# Patient Record
Sex: Male | Born: 1945 | Race: White | Hispanic: No | Marital: Married | Smoking: Never smoker
Health system: Southern US, Community
[De-identification: ages and names within clinical notes are randomized; demographics above are authoritative.]

## PROBLEM LIST (undated history)

## (undated) DIAGNOSIS — N189 Chronic kidney disease, unspecified: Secondary | ICD-10-CM

## (undated) DIAGNOSIS — N200 Calculus of kidney: Secondary | ICD-10-CM

## (undated) DIAGNOSIS — K5090 Crohn's disease, unspecified, without complications: Secondary | ICD-10-CM

## (undated) DIAGNOSIS — M8580 Other specified disorders of bone density and structure, unspecified site: Secondary | ICD-10-CM

## (undated) HISTORY — PX: COLOSTOMY: SHX63

---

## 2012-03-24 ENCOUNTER — Emergency Department: Payer: Self-pay | Admitting: Emergency Medicine

## 2012-03-24 LAB — CBC
HCT: 42 % (ref 40.0–52.0)
HGB: 13.8 g/dL (ref 13.0–18.0)
MCH: 31.3 pg (ref 26.0–34.0)
MCHC: 32.7 g/dL (ref 32.0–36.0)
MCV: 96 fL (ref 80–100)
RDW: 14.3 % (ref 11.5–14.5)
WBC: 10.5 10*3/uL (ref 3.8–10.6)

## 2012-03-24 LAB — COMPREHENSIVE METABOLIC PANEL
Albumin: 4.4 g/dL (ref 3.4–5.0)
Alkaline Phosphatase: 69 U/L (ref 50–136)
Anion Gap: 9 (ref 7–16)
Bilirubin,Total: 1 mg/dL (ref 0.2–1.0)
Calcium, Total: 9.4 mg/dL (ref 8.5–10.1)
EGFR (Non-African Amer.): 37 — ABNORMAL LOW
Glucose: 146 mg/dL — ABNORMAL HIGH (ref 65–99)
Potassium: 3.8 mmol/L (ref 3.5–5.1)
SGOT(AST): 32 U/L (ref 15–37)
SGPT (ALT): 32 U/L (ref 12–78)

## 2012-03-25 LAB — URINALYSIS, COMPLETE
Bacteria: NONE SEEN
Bilirubin,UR: NEGATIVE
Granular Cast: 11
Hyaline Cast: 5
Ketone: NEGATIVE
Nitrite: NEGATIVE
Ph: 6 (ref 4.5–8.0)
RBC,UR: 2 /HPF (ref 0–5)
Specific Gravity: 1.024 (ref 1.003–1.030)
Squamous Epithelial: <1
WBC UR: 3 /HPF (ref 0–5)

## 2013-12-31 IMAGING — CT CT ABD-PELV W/O CM
1 of 2 series · 15 of 32 positions shown, 19 images · non-contrast
Comparison: None

REASON FOR EXAM: (1) N/V/D; (2) H/O CROHN'S S/P COLOSTOMY
COMMENTS:   May transport without cardiac monitor

PROCEDURE:     CT  - CT ABDOMEN AND PELVIS W[DATE]  [DATE]
RESULT:     Indication: Flank Pain
TECHNIQUE: Multiple axial images from the lung bases to the symphysis pubis
were obtained without oral and without intravenous contrast.

[Series 2: 3mm soft tissue · axial · 0.64mm/px · z∈[-288,+134]mm · 15 of 153 slices shown, 19 images]
[im 6/153  soft-tissue]
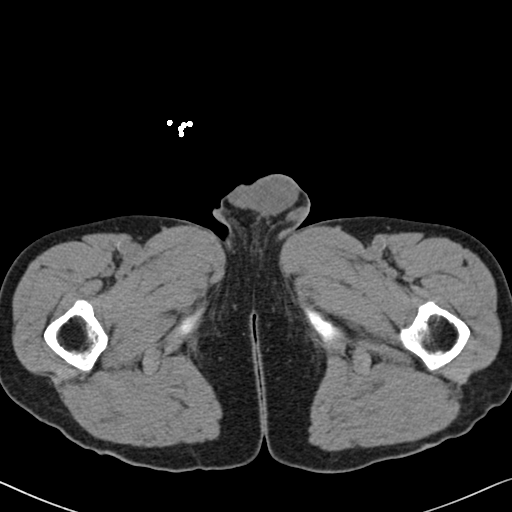
[im 6/153  bone]
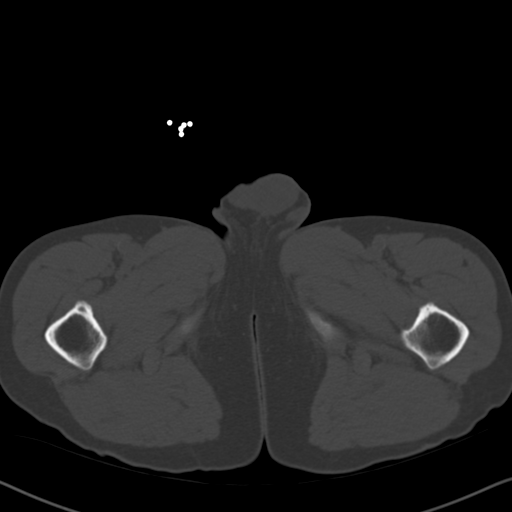
[im 18/153  soft-tissue]
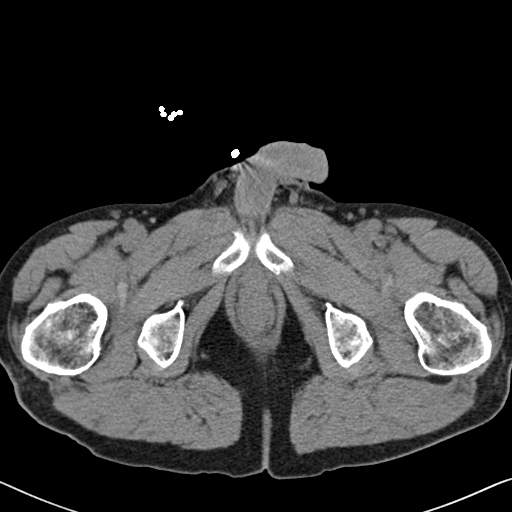
[im 30/153  soft-tissue]
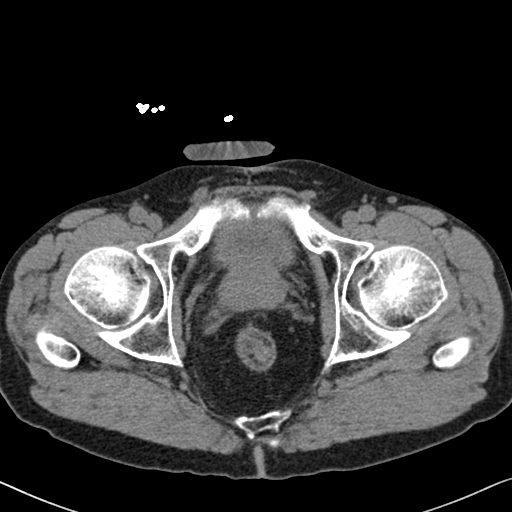
[im 41/153  soft-tissue]
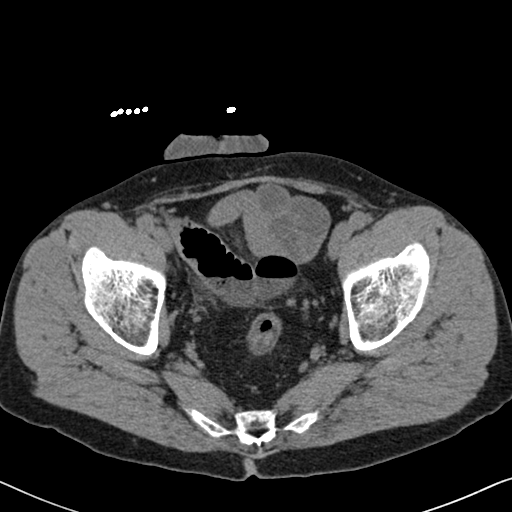
[im 53/153  soft-tissue]
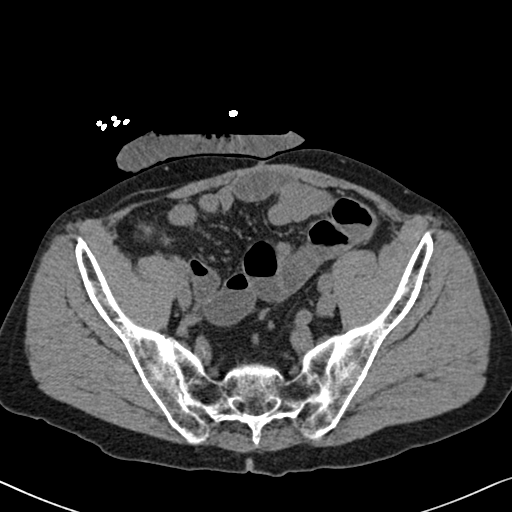
[im 65/153  soft-tissue]
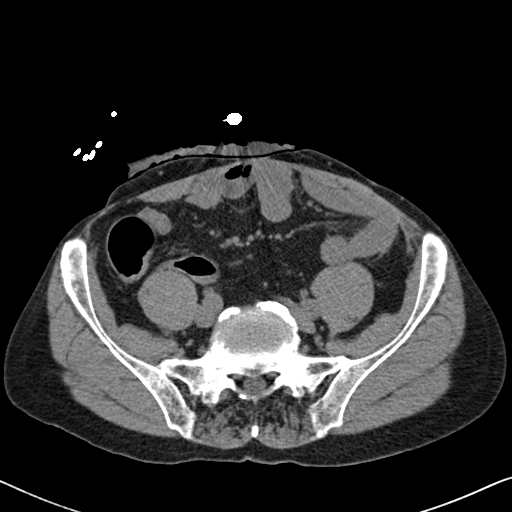
[im 77/153  soft-tissue]
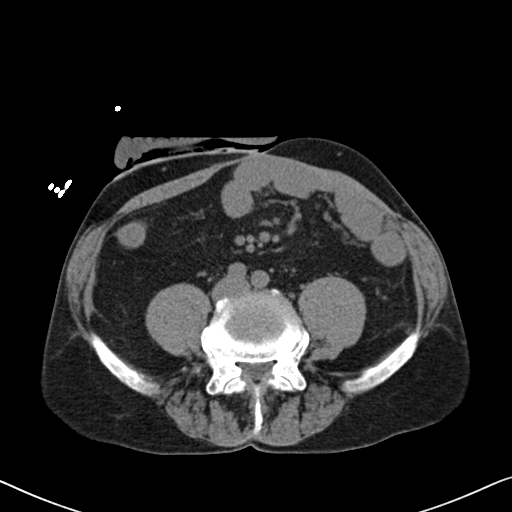
[im 88/153  soft-tissue]
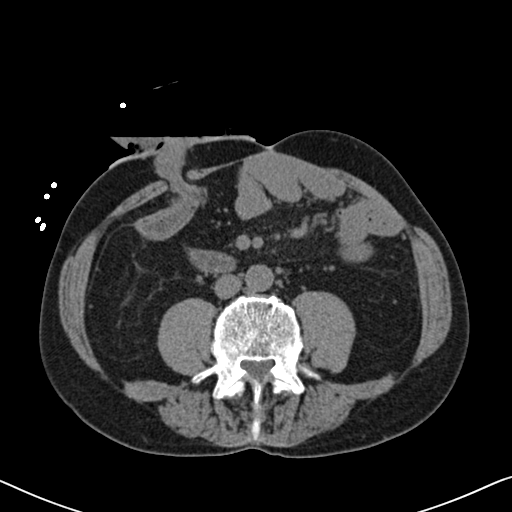
[im 100/153  soft-tissue]
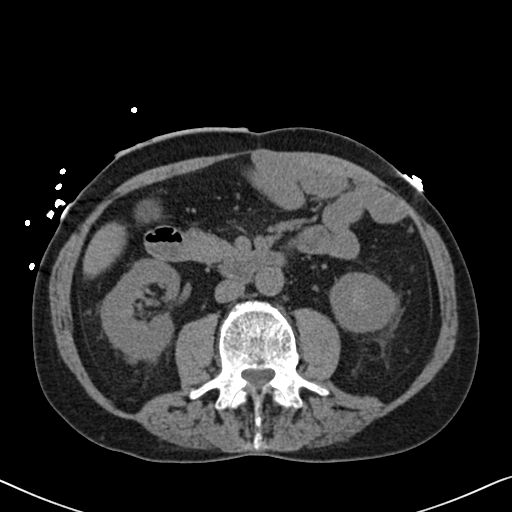
[im 100/153  bone]
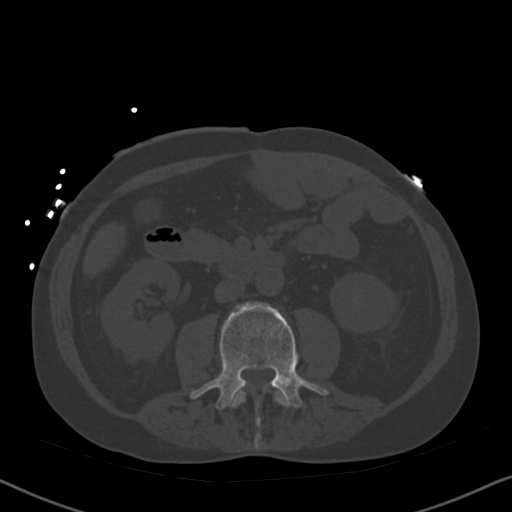
[im 112/153  soft-tissue]
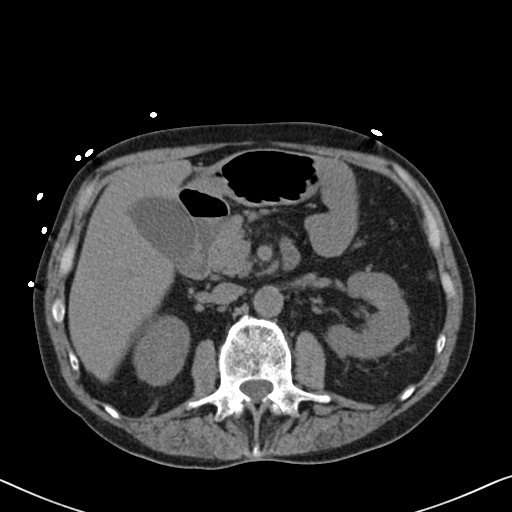
[im 123/153  soft-tissue]
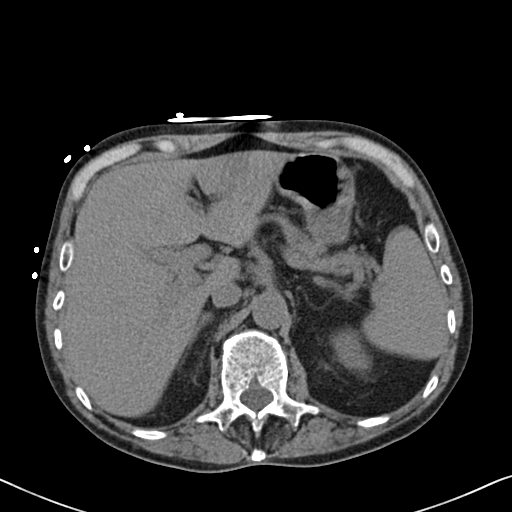
[im 129/153  lung]
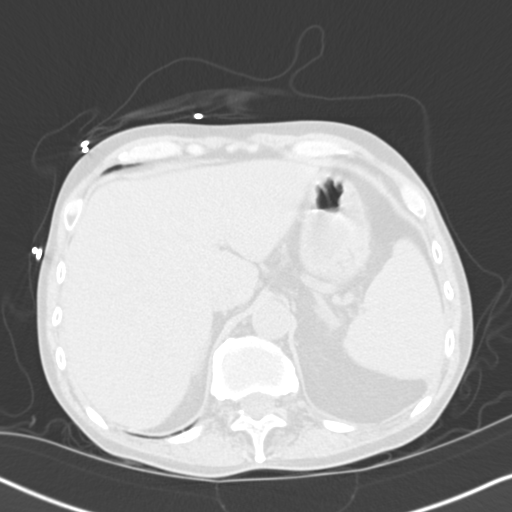
[im 135/153  soft-tissue]
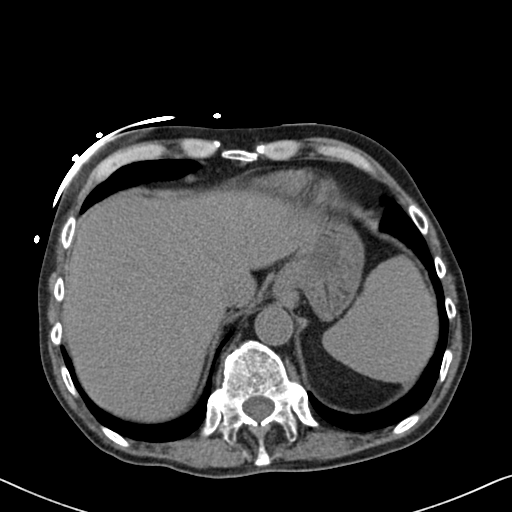
[im 135/153  lung]
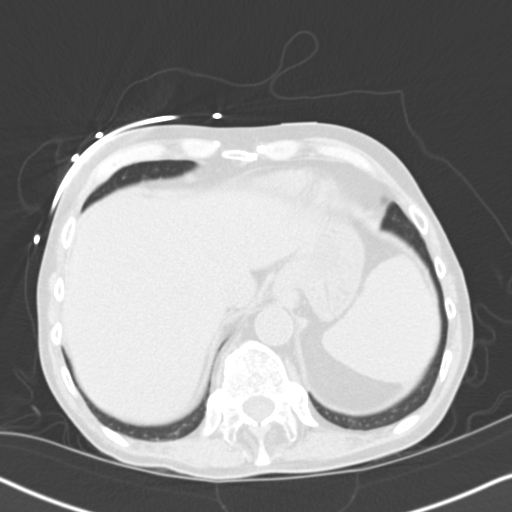
[im 141/153  lung]
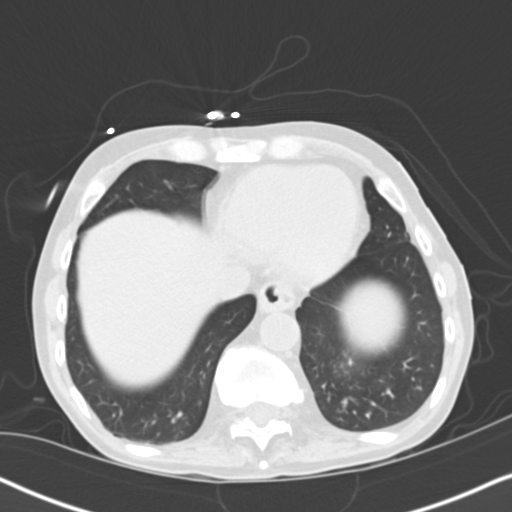
[im 147/153  soft-tissue]
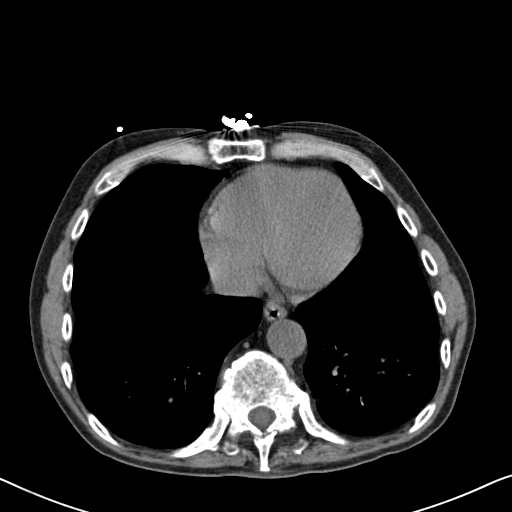
[im 147/153  lung]
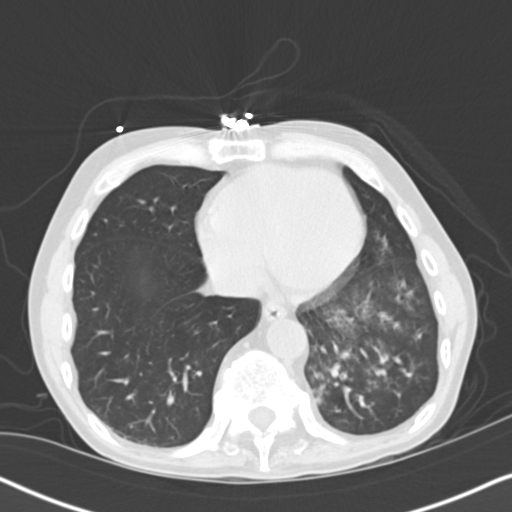

[15 of 32 positions shown; findings below may reference images not displayed]

FINDINGS: There is left lower lobe patchy airspace disease a with a tree in bud
appearance.

There is a nonobstructing left renal calculus. There is a 6.5 mm distal
right ureteral calculus without obstructive uropathy. No perinephric
stranding is seen. The kidneys are symmetric in size without evidence for
exophytic mass. The bladder is unremarkable.

The liver demonstrates no focal abnormality. The gallbladder is
unremarkable. The spleen demonstrates no focal abnormality. The adrenal
glands and pancreas are normal.

There is a right lower quadrant colostomy. There is no bowel dilatation
suggestive structure. There are a few small bowel air-fluid levels without
bowel wall thickening. There is no pneumoperitoneum, pneumatosis, or portal
venous gas. There is no abdominal or pelvic free fluid. There is no
lymphadenopathy.

The abdominal aorta is normal in caliber with atherosclerosis.

The osseous structures are unremarkable.
IMPRESSION: 1. There is a 6.5 mm distal right ureteral calculus without obstructive
uropathy.

2. There are a few small bowel air-fluid levels without bowel wall
thickening. This may secondary to enteritis secondary to an infectious or
inflammatory etiology. There is no bowel obstruction.

3.  Left liver lobe pneumonitis concerning for an infectious or inflammatory
etiology including atypical infection such as RITTMANN.

4. Nonobstructing left renal calculus.

[REDACTED]

## 2019-05-19 ENCOUNTER — Emergency Department
Admission: EM | Admit: 2019-05-19 | Discharge: 2019-05-19 | Disposition: A | Discharge status: Home / Self Care | Payer: BC Managed Care – PPO | Attending: Student | Admitting: Student

## 2019-05-19 ENCOUNTER — Other Ambulatory Visit: Payer: Self-pay

## 2019-05-19 ENCOUNTER — Encounter: Payer: Self-pay | Admitting: Emergency Medicine

## 2019-05-19 DIAGNOSIS — R42 Dizziness and giddiness: Secondary | ICD-10-CM | POA: Diagnosis present

## 2019-05-19 DIAGNOSIS — E860 Dehydration: Secondary | ICD-10-CM | POA: Diagnosis not present

## 2019-05-19 DIAGNOSIS — Z931 Gastrostomy status: Secondary | ICD-10-CM | POA: Insufficient documentation

## 2019-05-19 HISTORY — DX: Calculus of kidney: N20.0

## 2019-05-19 HISTORY — DX: Chronic kidney disease, unspecified: N18.9

## 2019-05-19 HISTORY — DX: Crohn's disease, unspecified, without complications: K50.90

## 2019-05-19 HISTORY — DX: Other specified disorders of bone density and structure, unspecified site: M85.80

## 2019-05-19 LAB — CBC WITH DIFFERENTIAL/PLATELET
Abs Immature Granulocytes: 0.04 10*3/uL (ref 0.00–0.07)
Basophils Absolute: 0 10*3/uL (ref 0.0–0.1)
Basophils Relative: 0 %
Eosinophils Absolute: 0 10*3/uL (ref 0.0–0.5)
Eosinophils Relative: 0 %
HCT: 40.8 % (ref 39.0–52.0)
Hemoglobin: 13.6 g/dL (ref 13.0–17.0)
Immature Granulocytes: 0 %
Lymphocytes Relative: 1 %
Lymphs Abs: 0.1 10*3/uL — ABNORMAL LOW (ref 0.7–4.0)
MCH: 31.5 pg (ref 26.0–34.0)
MCHC: 33.3 g/dL (ref 30.0–36.0)
MCV: 94.4 fL (ref 80.0–100.0)
Monocytes Absolute: 0.5 10*3/uL (ref 0.1–1.0)
Monocytes Relative: 5 %
Neutro Abs: 8.9 10*3/uL — ABNORMAL HIGH (ref 1.7–7.7)
Neutrophils Relative %: 94 %
Platelets: 144 10*3/uL — ABNORMAL LOW (ref 150–400)
RBC: 4.32 MIL/uL (ref 4.22–5.81)
RDW: 13.8 % (ref 11.5–15.5)
WBC: 9.5 10*3/uL (ref 4.0–10.5)
nRBC: 0 % (ref 0.0–0.2)

## 2019-05-19 LAB — COMPREHENSIVE METABOLIC PANEL
ALT: 24 U/L (ref 0–44)
AST: 29 U/L (ref 15–41)
Albumin: 4.5 g/dL (ref 3.5–5.0)
Alkaline Phosphatase: 43 U/L (ref 38–126)
Anion gap: 12 (ref 5–15)
BUN: 30 mg/dL — ABNORMAL HIGH (ref 8–23)
CO2: 21 mmol/L — ABNORMAL LOW (ref 22–32)
Calcium: 9.1 mg/dL (ref 8.9–10.3)
Chloride: 103 mmol/L (ref 98–111)
Creatinine, Ser: 2.16 mg/dL — ABNORMAL HIGH (ref 0.61–1.24)
GFR calc Af Amer: 34 mL/min — ABNORMAL LOW (ref >60)
GFR calc non Af Amer: 30 mL/min — ABNORMAL LOW (ref >60)
Glucose, Bld: 117 mg/dL — ABNORMAL HIGH (ref 70–99)
Potassium: 3.9 mmol/L (ref 3.5–5.1)
Sodium: 136 mmol/L (ref 135–145)
Total Bilirubin: 0.9 mg/dL (ref 0.3–1.2)
Total Protein: 8.4 g/dL — ABNORMAL HIGH (ref 6.5–8.1)

## 2019-05-19 LAB — TROPONIN I (HIGH SENSITIVITY): Troponin I (High Sensitivity): 6 ng/L (ref <18)

## 2019-05-19 MED ORDER — SODIUM CHLORIDE 0.9 % IV BOLUS
500.0000 mL | Freq: Once | INTRAVENOUS | Status: AC
  Administered 2019-05-19: 500 mL via INTRAVENOUS

## 2019-05-19 MED ORDER — SODIUM CHLORIDE 0.9 % IV BOLUS
1500.0000 mL | Freq: Once | INTRAVENOUS | Status: AC
  Administered 2019-05-19: 1500 mL via INTRAVENOUS

## 2019-05-19 MED ORDER — ACETAMINOPHEN 500 MG PO TABS
1000.0000 mg | ORAL_TABLET | Freq: Once | ORAL | Status: AC
  Administered 2019-05-19: 1000 mg via ORAL
  Filled 2019-05-19: qty 2

## 2019-05-19 MED ORDER — ONDANSETRON 4 MG PO TBDP
4.0000 mg | ORAL_TABLET | Freq: Once | ORAL | Status: AC
  Administered 2019-05-19: 4 mg via ORAL
  Filled 2019-05-19: qty 1

## 2019-05-19 NOTE — ED Notes (Signed)

## 2019-05-19 NOTE — ED Triage Notes (Signed)
Pt to triage via w/c with no distress noted, mask in place; EMS brought pt in from home for onset dizziness PTA while emptying colostomy bag; pt reports colostomy has had recent increased output; denies any c/o at present

## 2019-05-19 NOTE — ED Notes (Signed)
PAtient given gingerale

## 2019-05-19 NOTE — ED Provider Notes (Signed)
[facility] Emergency Department Provider Note  ____________________________________________   First MD Initiated Contact with Patient 05/19/19 [phone]     (approximate)  I have reviewed the triage vital signs and the nursing notes.  History  Chief Complaint Dizziness    HPI Dylan Rich. is a 74 y.o. male with history CKD, Crohn's disease status post colostomy formation, who presents to the emergency department for dehydration.  Patient states every once in a while he has paroxysms of increased ostomy output related to his Crohn's disease.  These are not Crohn's flares, but rather brief episodes where his ostomy output increases. No bloody stool and changes to the appearance of output.  He typically does not have to empty his ostomy overnight, but since around midnight he has emptied his ostomy 4+ times.  Ostomy holds about 250 cc of fluids.  After the last time emptying at home he could tell he was dehydrated, mouth was dry, felt lightheaded upon standing, felt presyncopal (no actual LOC), therefore presented to the emergency department for further evaluation and rehydration.  He feels his output is slowly starting to decrease since being in the ED.  Patient denies any fevers, vomiting.  No recent antibiotics.  No recent exotic travel or new foods.  Follows closely with GI.  Patient is an internal medicine physician.   Past Medical Hx Past Medical History:  Diagnosis Date  . CKD (chronic kidney disease)   . Crohn disease ([place])   . Kidney stone   . Osteopenia     Problem List There are no problems to display for this patient.   Past Surgical Hx Past Surgical History:  Procedure Laterality Date  . COLOSTOMY      Medications Prior to Admission medications   Not on File    Allergies Nsaids  Family Hx No family history on file.  Social Hx Social History   Tobacco Use  . Smoking status: Never Smoker  . Smokeless tobacco: Never Used    Substance Use Topics  . Alcohol use: Not Currently  . Drug use: Not on file     Review of Systems  Constitutional: Negative for fever, chills.  Positive for lightheadedness, presyncope, dehydration. Eyes: Negative for visual changes. ENT: Negative for sore throat. Cardiovascular: Negative for chest pain. Respiratory: Negative for shortness of breath. Gastrointestinal: Negative for nausea, vomiting.  Positive for increased ostomy output. Genitourinary: Negative for dysuria. Musculoskeletal: Negative for leg swelling. Skin: Negative for rash. Neurological: Negative for headaches.     Physical Exam  Vital Signs: ED Triage Vitals  Enc Vitals Group     BP 05/19/19 0637 (!) 156/69     Pulse Rate 05/19/19 0637 (!) 110     Resp 05/19/19 0637 20     Temp 05/19/19 0637 98.5 F (36.9 C)     Temp Source 05/19/19 0637 Oral     SpO2 05/19/19 0637 100 %     Weight 05/19/19 0629 145 lb (65.8 kg)     Height 05/19/19 0629 5\' 9"  (1.753 m)     Head Circumference --      Peak Flow --      Pain Score 05/19/19 0629 0     Pain Loc --      Pain Edu? --      Excl. in [place]? --     Constitutional: Alert and oriented.  Head: Normocephalic. Atraumatic. Eyes: Conjunctivae clear. Sclera anicteric. Nose: No congestion. No rhinorrhea. Mouth/Throat: Wearing mask. MM slightly dry.  Neck: No stridor.   Cardiovascular: Mild tachycardia, regular rhythm. Extremities well perfused. Respiratory: Normal respiratory effort.  Lungs CTAB. Gastrointestinal: Soft. Non-tender. Non-distended.  Ostomy in right lower quadrant. Musculoskeletal: No lower extremity edema. No deformities. Neurologic:  Normal speech and language. No gross focal neurologic deficits are appreciated.  Skin: Skin is warm, dry and intact. No rash noted. Psychiatric: Mood and affect are appropriate for situation.  EKG  Personally reviewed.  Obtained 05/19/2019 6:52 AM.  Rate: 112 Rhythm: sinus Axis: leftward Intervals:  WNL Non-specific ST/T wave findings I, aVL, V2 No STEMI    Procedures  Procedure(s) performed (including critical care):  Procedures   Initial Impression / Assessment and Plan / ED Course  74 y.o. male who presents to the ED with concern for dehydration in the setting of paroxysm of increased ostomy output.  Patient with history of Crohn's, as above.  Ddx: dehydration, AKI, GI viral illness, electrolyte abnormality, anemia, less likely atypical ACS or arrhythmia  Labs initiated in triage reveal mild bump in creatinine 2.16 from 1.89 previously, consistent with dehydration.  Remainder of electrolytes without actionable derangements.  No anemia.  High-sensitivity troponin negative.  We will plan for IV fluids, reassess.  Patient feeling improved after IV fluids.  Ostomy output decreasing. Producing urine. Tolerated PO without issue.  As such, will plan for discharge with outpatient follow-up.  Given return precautions. Patient agreeable.   Final Clinical Impression(s) / ED Diagnosis  Final diagnoses:  Dehydration       Note:  This document was prepared using Dragon voice recognition software and may include unintentional dictation errors.   Miguel Aschoff., MD 05/19/19 [phone]

## 2019-05-19 NOTE — ED Notes (Signed)
Pt assisted to restroom to empty his colostomy. Also pt reports nausea, zofran given ODT.

## 2019-05-19 NOTE — Discharge Instructions (Addendum)
Thank you for letting us take care of you in the emergency department today.   Please continue to take any regular, prescribed medications. Continue to stay well hydrated and eat a healthy diet.   Please follow up with: - Your PCP/GI as needed to review your ER visit and follow up on your symptoms.   Please return to the ER for any new or worsening symptoms.
